# Patient Record
Sex: Female | Born: 2000 | Race: White | Hispanic: No | Marital: Single | Smoking: Never smoker
Health system: Southern US, Community
[De-identification: ages and names within clinical notes are randomized; demographics above are authoritative.]

## PROBLEM LIST (undated history)

## (undated) DIAGNOSIS — R51 Headache: Secondary | ICD-10-CM

## (undated) DIAGNOSIS — M069 Rheumatoid arthritis, unspecified: Secondary | ICD-10-CM

## (undated) DIAGNOSIS — M1990 Unspecified osteoarthritis, unspecified site: Secondary | ICD-10-CM

## (undated) DIAGNOSIS — R519 Headache, unspecified: Secondary | ICD-10-CM

## (undated) HISTORY — DX: Headache: R51

## (undated) HISTORY — DX: Headache, unspecified: R51.9

---
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
## 2022-01-30 ENCOUNTER — Emergency Department (HOSPITAL_BASED_OUTPATIENT_CLINIC_OR_DEPARTMENT_OTHER): Payer: Managed Care, Other (non HMO)

## 2022-01-30 ENCOUNTER — Other Ambulatory Visit: Payer: Self-pay

## 2022-01-30 ENCOUNTER — Emergency Department (HOSPITAL_BASED_OUTPATIENT_CLINIC_OR_DEPARTMENT_OTHER)
Admission: EM | Admit: 2022-01-30 | Discharge: 2022-01-30 | Disposition: A | Discharge status: Home / Self Care | Payer: Managed Care, Other (non HMO) | Attending: Emergency Medicine | Admitting: Emergency Medicine

## 2022-01-30 ENCOUNTER — Encounter (HOSPITAL_BASED_OUTPATIENT_CLINIC_OR_DEPARTMENT_OTHER): Payer: Self-pay | Admitting: *Deleted

## 2022-01-30 DIAGNOSIS — Z3A01 Less than 8 weeks gestation of pregnancy: Secondary | ICD-10-CM | POA: Diagnosis not present

## 2022-01-30 DIAGNOSIS — R1032 Left lower quadrant pain: Secondary | ICD-10-CM | POA: Diagnosis not present

## 2022-01-30 DIAGNOSIS — Z3491 Encounter for supervision of normal pregnancy, unspecified, first trimester: Secondary | ICD-10-CM

## 2022-01-30 DIAGNOSIS — O26891 Other specified pregnancy related conditions, first trimester: Secondary | ICD-10-CM | POA: Insufficient documentation

## 2022-01-30 DIAGNOSIS — O0281 Inappropriate change in quantitative human chorionic gonadotropin (hCG) in early pregnancy: Secondary | ICD-10-CM | POA: Insufficient documentation

## 2022-01-30 DIAGNOSIS — R1030 Lower abdominal pain, unspecified: Secondary | ICD-10-CM

## 2022-01-30 HISTORY — DX: Rheumatoid arthritis, unspecified: M06.9

## 2022-01-30 LAB — COMPREHENSIVE METABOLIC PANEL
ALT: 60 U/L — ABNORMAL HIGH (ref 0–44)
AST: 46 U/L — ABNORMAL HIGH (ref 15–41)
Albumin: 4.3 g/dL (ref 3.5–5.0)
Alkaline Phosphatase: 72 U/L (ref 38–126)
Anion gap: 8 (ref 5–15)
BUN: <5 mg/dL — ABNORMAL LOW (ref 6–20)
CO2: 23 mmol/L (ref 22–32)
Calcium: 9 mg/dL (ref 8.9–10.3)
Chloride: 104 mmol/L (ref 98–111)
Creatinine, Ser: 0.47 mg/dL (ref 0.44–1.00)
GFR, Estimated: >60 mL/min (ref >60)
Glucose, Bld: 94 mg/dL (ref 70–99)
Potassium: 3.6 mmol/L (ref 3.5–5.1)
Sodium: 135 mmol/L (ref 135–145)
Total Bilirubin: 0.6 mg/dL (ref 0.3–1.2)
Total Protein: 7.5 g/dL (ref 6.5–8.1)

## 2022-01-30 LAB — WET PREP, GENITAL
Sperm: NONE SEEN
Trich, Wet Prep: NONE SEEN
WBC, Wet Prep HPF POC: >=10 — AB (ref <10)
Yeast Wet Prep HPF POC: NONE SEEN

## 2022-01-30 LAB — CBC WITH DIFFERENTIAL/PLATELET
Abs Immature Granulocytes: 0.01 10*3/uL (ref 0.00–0.07)
Basophils Absolute: 0 10*3/uL (ref 0.0–0.1)
Basophils Relative: 0 %
Eosinophils Absolute: 0.1 10*3/uL (ref 0.0–0.5)
Eosinophils Relative: 1 %
HCT: 40 % (ref 36.0–46.0)
Hemoglobin: 13.7 g/dL (ref 12.0–15.0)
Immature Granulocytes: 0 %
Lymphocytes Relative: 39 %
Lymphs Abs: 2.9 10*3/uL (ref 0.7–4.0)
MCH: 30.3 pg (ref 26.0–34.0)
MCHC: 34.3 g/dL (ref 30.0–36.0)
MCV: 88.5 fL (ref 80.0–100.0)
Monocytes Absolute: 0.5 10*3/uL (ref 0.1–1.0)
Monocytes Relative: 6 %
Neutro Abs: 4 10*3/uL (ref 1.7–7.7)
Neutrophils Relative %: 54 %
Platelets: 315 10*3/uL (ref 150–400)
RBC: 4.52 MIL/uL (ref 3.87–5.11)
RDW: 12.3 % (ref 11.5–15.5)
WBC: 7.5 10*3/uL (ref 4.0–10.5)
nRBC: 0 % (ref 0.0–0.2)

## 2022-01-30 LAB — URINALYSIS, ROUTINE W REFLEX MICROSCOPIC
Bilirubin Urine: NEGATIVE
Glucose, UA: NEGATIVE mg/dL
Hgb urine dipstick: NEGATIVE
Ketones, ur: NEGATIVE mg/dL
Nitrite: NEGATIVE
Protein, ur: NEGATIVE mg/dL
Specific Gravity, Urine: 1.015 (ref 1.005–1.030)
pH: 6.5 (ref 5.0–8.0)

## 2022-01-30 LAB — PREGNANCY, URINE: Preg Test, Ur: POSITIVE — AB

## 2022-01-30 LAB — HCG, QUANTITATIVE, PREGNANCY: hCG, Beta Chain, Quant, S: 4292 m[IU]/mL — ABNORMAL HIGH (ref <5)

## 2022-01-30 LAB — URINALYSIS, MICROSCOPIC (REFLEX): RBC / HPF: NONE SEEN RBC/hpf (ref 0–5)

## 2022-01-30 MED ORDER — ACETAMINOPHEN 500 MG PO TABS
1000.0000 mg | ORAL_TABLET | Freq: Once | ORAL | Status: AC
  Administered 2022-01-30: 1000 mg via ORAL
  Filled 2022-01-30: qty 2

## 2022-01-30 NOTE — Discharge Instructions (Signed)
The labs today are consistent with an early pregnancy and your ultrasound shows that you most likely have an early pregnancy in your uterus and is just really early and you will need a repeat ultrasound to confirm.  There is no evidence of urinary tract infection or abnormal swabs today.  You can take Tylenol as needed for the pain and you can continue drinking fluids.  You should follow-up with the OB or return to the ER if you start having severe pain and vaginal bleeding.  It is not unusual to have a little bit of spotting after the exam you had today.

## 2022-01-30 NOTE — ED Provider Notes (Signed)
[facility] EMERGENCY DEPARTMENT Provider Note   CSN: [id number] Arrival date & time: 01/30/22  1747     History  Chief Complaint  Patient presents with   Abdominal Pain    Gwendolyn Herrera is a 21 y.o. female.  Patient is a 21 year old G1 P0 who is approximately [redacted] weeks pregnant presenting today with complaints of lower abdominal pain.  She reports the pain was in the center yesterday but then has moved to the left side today.  It is a sharp stabbing type pain that is now constant.  She did take Tylenol yesterday and it helped but has not taken anything today.  She has not had any vaginal discharge or bleeding.  She denies any nausea or vomiting.  No fever or dysuria.  The history is provided by the patient.  Abdominal Pain     Home Medications Prior to Admission medications   Medication Sig Start Date End Date Taking? Authorizing Provider  Abatacept (ORENCIA IV) Inject into the vein. Gets injections every 3 weeks    [provider]  amitriptyline (ELAVIL) 25 MG tablet TAKE 1 & 1/2 TABLETS BY MOUTH AT BEDTIME. 05/22/15   Teressa Lower, MD  azithromycin (ZITHROMAX Z-PAK) 250 MG tablet Take 2 tabs today; then begin one tab once daily for 4 more days. (Rx void after 02/14/15) 02/06/15   Kandra Nicolas, MD  benzonatate (TESSALON) 200 MG capsule Take 1 capsule (200 mg total) by mouth at bedtime. Take as needed for cough 02/06/15   Kandra Nicolas, MD  folic acid (FOLVITE) 1 MG tablet Take 1 mg by mouth.    [provider]  leflunomide (ARAVA) 10 MG tablet Take 10 mg by mouth.    [provider]  Magnesium Oxide 500 MG TABS Take by mouth.    [provider]  montelukast (SINGULAIR) 10 MG tablet Take 10 mg by mouth.    [provider]  nabumetone (RELAFEN) 750 MG tablet Take 750 mg by mouth.    [provider]  riboflavin (VITAMIN B-2) 100 MG TABS tablet Take 100 mg by mouth daily.    [provider]  rizatriptan  (MAXALT-MLT) 5 MG disintegrating tablet Take 1 tablet (5 mg total) by mouth as needed for migraine. Maximum 2 tablets a week 10/25/14   Teressa Lower, MD      Allergies    Patient has no known allergies.    Review of Systems   Review of Systems  Gastrointestinal:  Positive for abdominal pain.   Physical Exam Updated Vital Signs BP 125/73 (BP Location: Left Arm)    Pulse 91    Temp 98.6 F (37 C) (Oral)    Resp 16    Ht 5\' 3"  (1.6 m)    Wt 63.5 kg    LMP 12/25/2021    SpO2 100%    BMI 24.80 kg/m  Physical Exam Vitals and nursing note reviewed.  Constitutional:      General: She is not in acute distress.    Appearance: She is well-developed.  HENT:     Head: Normocephalic and atraumatic.  Eyes:     Conjunctiva/sclera: Conjunctivae normal.     Pupils: Pupils are equal, round, and reactive to light.  Cardiovascular:     Rate and Rhythm: Normal rate and regular rhythm.     Heart sounds: No murmur heard. Pulmonary:     Effort: Pulmonary effort is normal. No respiratory distress.     Breath sounds: Normal breath  sounds. No wheezing or rales.  Abdominal:     General: There is no distension.     Palpations: Abdomen is soft.     Tenderness: There is abdominal tenderness in the left lower quadrant. There is no left CVA tenderness, guarding or rebound.  Genitourinary:    Vagina: Normal.     Cervix: Normal.     Uterus: Normal.      Adnexa: Right adnexa normal and left adnexa normal.       Right: No mass, tenderness or fullness.         Left: No mass, tenderness or fullness.    Musculoskeletal:        General: No tenderness. Normal range of motion.     Cervical back: Normal range of motion and neck supple.  Skin:    General: Skin is warm and dry.     Findings: No erythema or rash.  Neurological:     Mental Status: She is alert and oriented to person, place, and time.  Psychiatric:        Behavior: Behavior normal.    ED Results / Procedures / Treatments   Labs (all labs  ordered are listed, but only abnormal results are displayed) Labs Reviewed  WET PREP, GENITAL - Abnormal; Notable for the following components:      Result Value   Clue Cells Wet Prep HPF POC PRESENT (*)    WBC, Wet Prep HPF POC >=10 (*)    All other components within normal limits  COMPREHENSIVE METABOLIC PANEL - Abnormal; Notable for the following components:   BUN <5 (*)    AST 46 (*)    ALT 60 (*)    All other components within normal limits  URINALYSIS, ROUTINE W REFLEX MICROSCOPIC - Abnormal; Notable for the following components:   Leukocytes,Ua TRACE (*)    All other components within normal limits  PREGNANCY, URINE - Abnormal; Notable for the following components:   Preg Test, Ur POSITIVE (*)    All other components within normal limits  HCG, QUANTITATIVE, PREGNANCY - Abnormal; Notable for the following components:   hCG, Beta Chain, Quant, S 4,292 (*)    All other components within normal limits  URINALYSIS, MICROSCOPIC (REFLEX) - Abnormal; Notable for the following components:   Bacteria, UA RARE (*)    All other components within normal limits  CBC WITH DIFFERENTIAL/PLATELET  GC/CHLAMYDIA PROBE AMP ([place]) NOT AT [facility]    EKG None  Radiology US OB LESS THAN 14 WEEKS WITH OB TRANSVAGINAL  Result Date: 01/30/2022 CLINICAL DATA:  Lower abdominal pain during pregnancy. EXAM: OBSTETRIC <14 WK [place] AND TRANSVAGINAL OB US TECHNIQUE: Both transabdominal and transvaginal ultrasound examinations were performed for complete evaluation of the gestation as well as the maternal uterus, adnexal regions, and pelvic cul-de-sac. Transvaginal technique was performed to assess early pregnancy. COMPARISON:  None. FINDINGS: Intrauterine gestational sac: Single Yolk sac:  Visualized. Embryo:  Visualized. MSD: 4.5 mm mm   5 w   2 d Subchorionic hemorrhage:  None visualized. Maternal uterus/adnexae: There is a corpus luteum versus collapsing hemorrhagic cyst in the right ovary measuring 1.5 x  0.7 by 1.5 cm. Left ovary is within normal limits. No free fluid identified in the pelvis. IMPRESSION: Probable early intrauterine gestational sac, but no yolk sac, fetal pole, or cardiac activity yet visualized. Recommend follow-up quantitative B-HCG levels and follow-up US in 14 days to assess viability. This recommendation follows SRU consensus guidelines: Diagnostic Criteria for Nonviable Pregnancy Early in  the First Trimester. [place] [place] Med [id number]:[id number]. Electronically Signed   By: Ronney Asters M.D.   On: 01/30/2022 19:56    Procedures Procedures    Medications Ordered in ED Medications  acetaminophen (TYLENOL) tablet 1,000 mg (1,000 mg Oral Given 01/30/22 1815)    ED Course/ Medical Decision Making/ A&P                           Medical Decision Making Amount and/or Complexity of Data Reviewed Labs: ordered. Radiology: ordered.  Risk OTC drugs.   Patient is a 21 year old female presenting today with left sided abdominal pain in the setting of being approximately [redacted] weeks pregnant.  Patient denies any other symptoms other than pain.  She has not seen an OB/GYN yet.  This is her first time being pregnant.  She denies any urinary symptoms.  Concern for possible ectopic pregnancy versus normal pains from early pregnancy.  Low suspicion for TOA, ovarian cyst, UTI or kidney stone.  No symptoms to suggest appendicitis.  8:14 PM I independently interpreted patient's labs and she has a positive urine pregnancy test with a negative UA and normal CBC.  Transvaginal ultrasound is pending and hCG.  8:14 PM I independently evaluated patient's ultrasound and it appears she has a gestational sac in the uterus.  Radiology reports Assubel early intrauterine gestational sac but no yolk sac or fetal pole at this time.  Recommend follow-up hCG.  Current hCG is 4000.  Patient is otherwise well-appearing.  Findings discussed with the patient and her partner.  She was discharged home in good condition.   No indication for admission at this time.       Final Clinical Impression(s) / ED Diagnoses Final diagnoses:  First trimester pregnancy    Rx / DC Orders ED Discharge Orders     None         Blanchie Dessert, MD 01/30/22 2015

## 2022-01-30 NOTE — ED Triage Notes (Signed)
C/o lower left abd pain with nausea x 2 days, estimated 5 weeks preg LMP 12/29. Denies vaginal bleeding

## 2022-01-30 NOTE — ED Notes (Signed)
Pt A&OX4 ambulatory at d/c with independent steady gait, NAD. Pt verbalized understanding of d/c instructions and follow up care. ?

## 2022-01-31 LAB — GC/CHLAMYDIA PROBE AMP (~~LOC~~) NOT AT ARMC
Chlamydia: NEGATIVE
Comment: NEGATIVE
Comment: NORMAL
Neisseria Gonorrhea: NEGATIVE

## 2023-01-13 IMAGING — US US OB < 14 WEEKS - US OB TV
2 series · 13 of 28 positions shown · non-contrast
Comparison: None.

CLINICAL DATA: Lower abdominal pain during pregnancy.

EXAM:
OBSTETRIC <14 WK US AND TRANSVAGINAL OB US
TECHNIQUE: Both transabdominal and transvaginal ultrasound examinations were
performed for complete evaluation of the gestation as well as the
maternal uterus, adnexal regions, and pelvic cul-de-sac.
Transvaginal technique was performed to assess early pregnancy.

[Series 1: us ob < 14 weeks - us ob tv · 4 of 27 slices shown (1 of 2)]
[im 4/27]
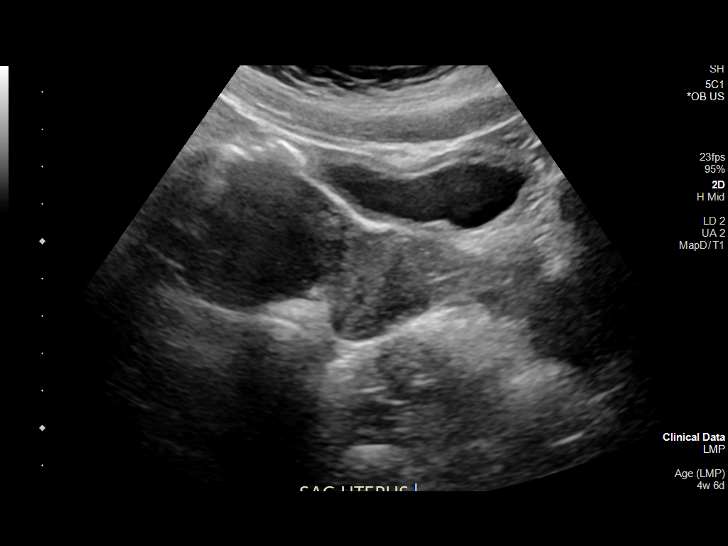
[im 10/27]
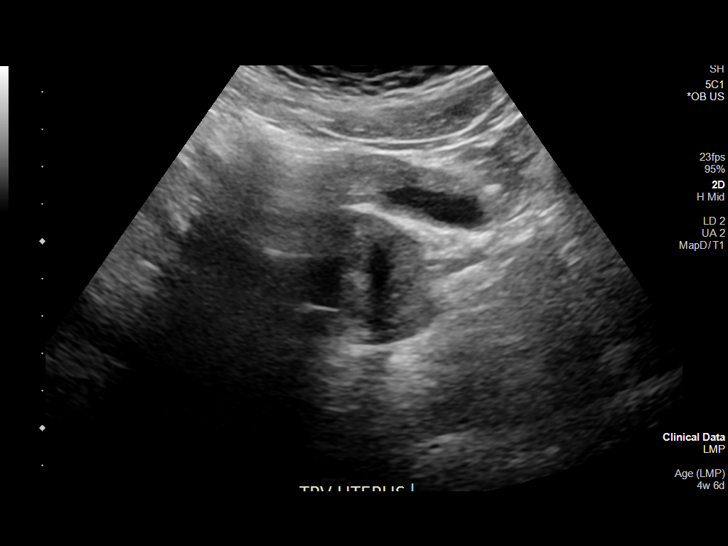
[im 17/27]
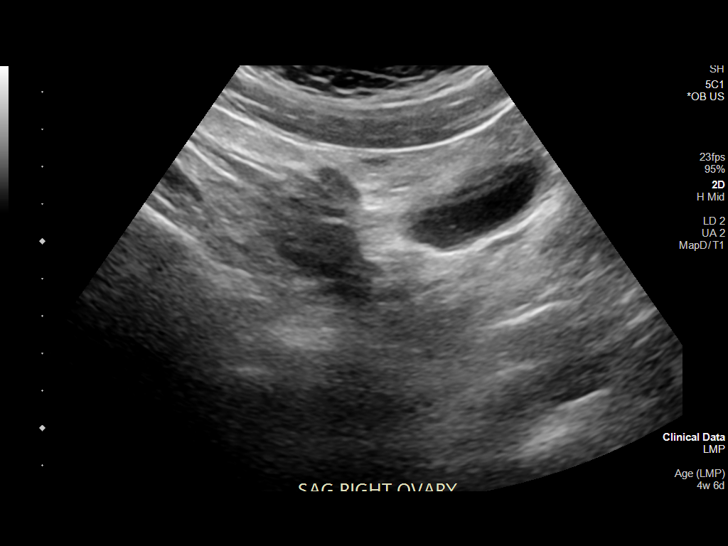
[im 23/27]
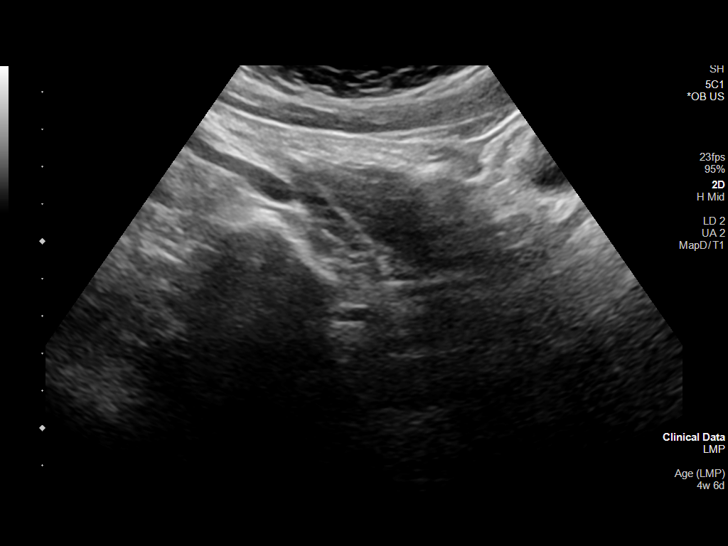

[Series 2: us ob < 14 weeks - us ob tv · 9 of 61 slices shown (2 of 2)]
[im 1/61]
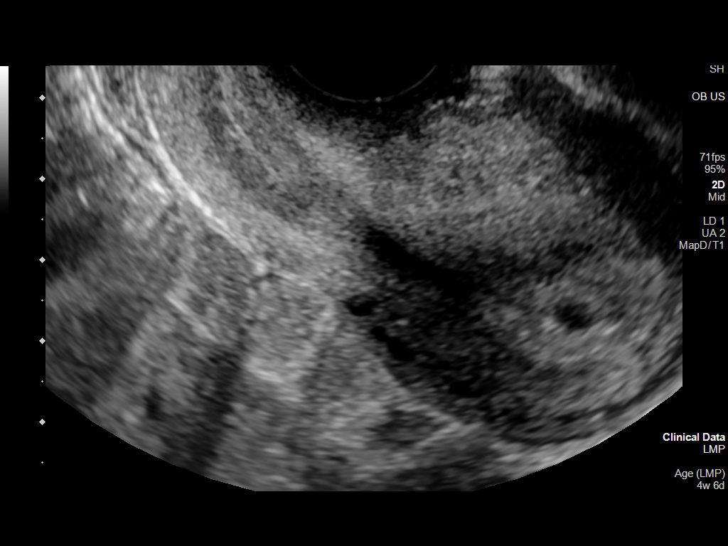
[im 7/61]
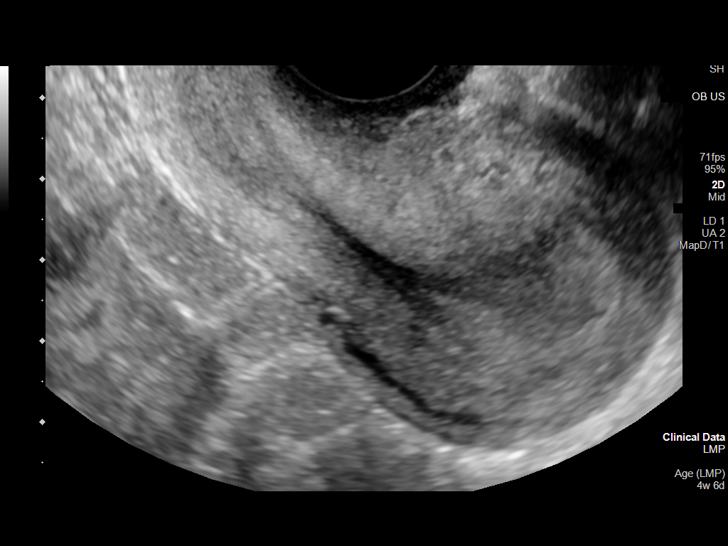
[im 17/61]
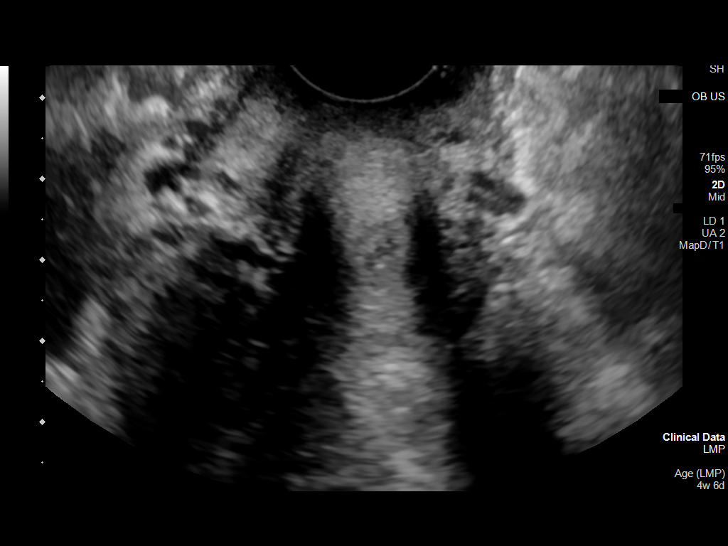
[im 24/61]
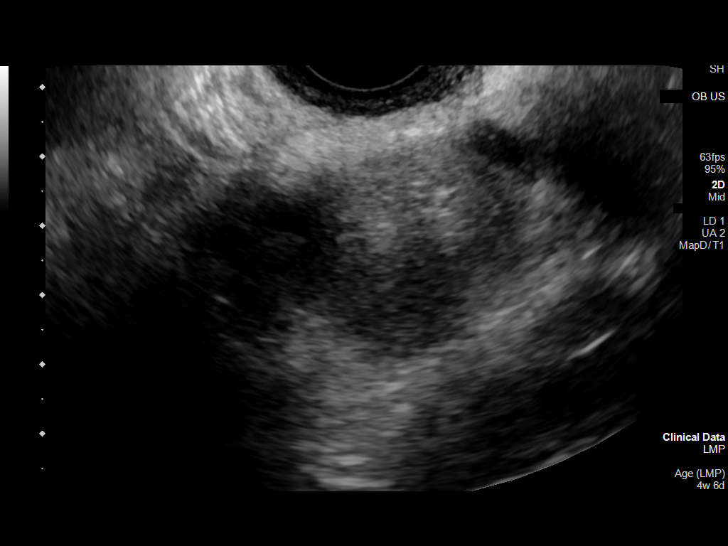
[im 31/61]
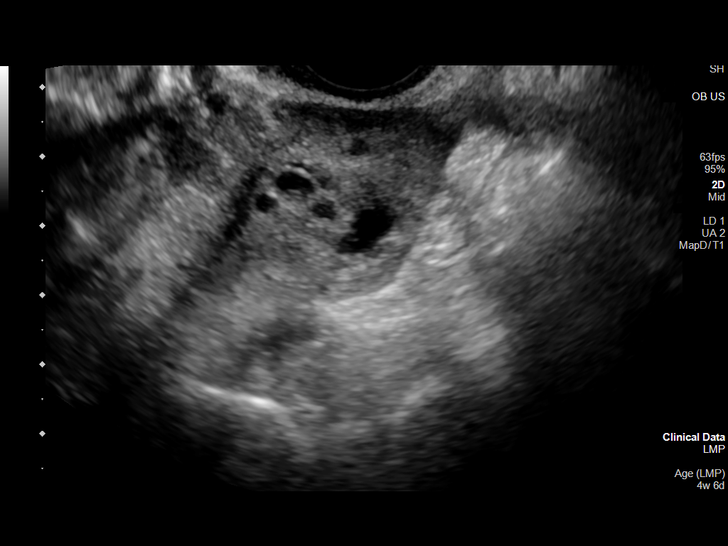
[im 37/61]
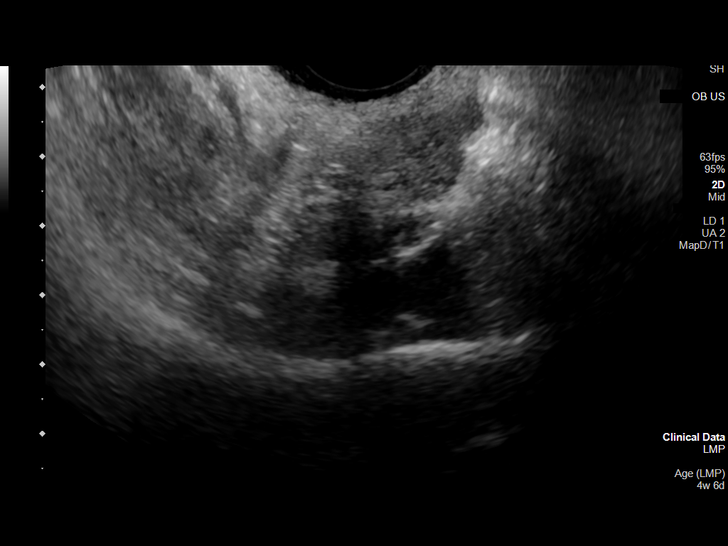
[im 44/61]
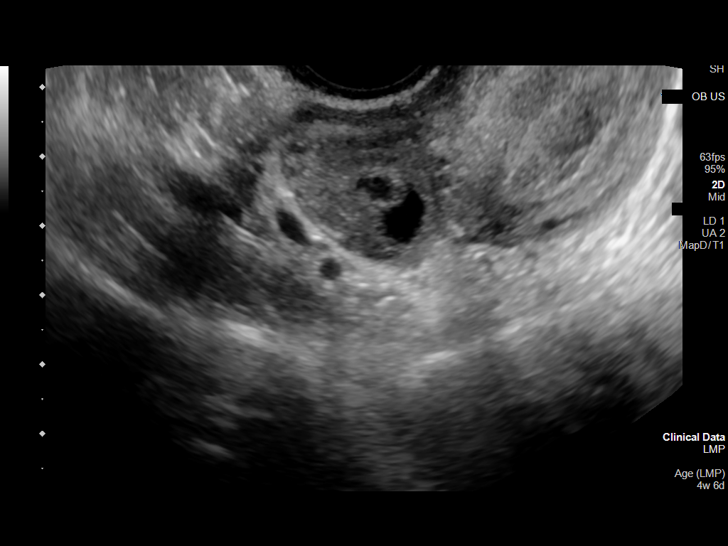
[im 51/61]
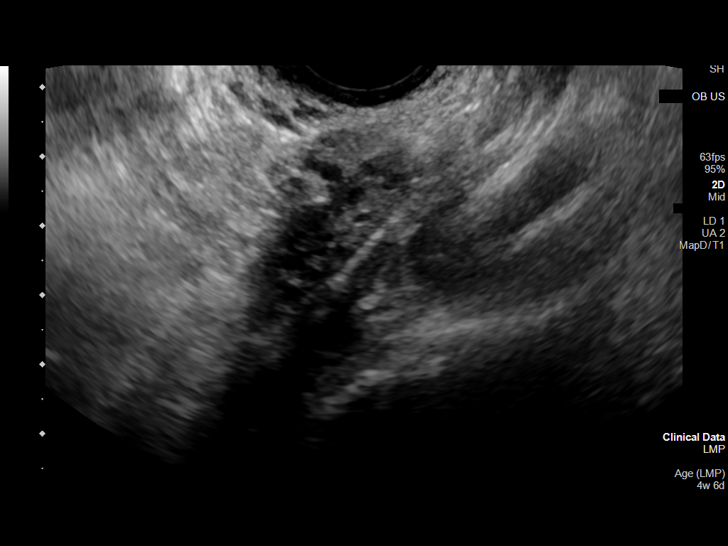
[im 57/61]
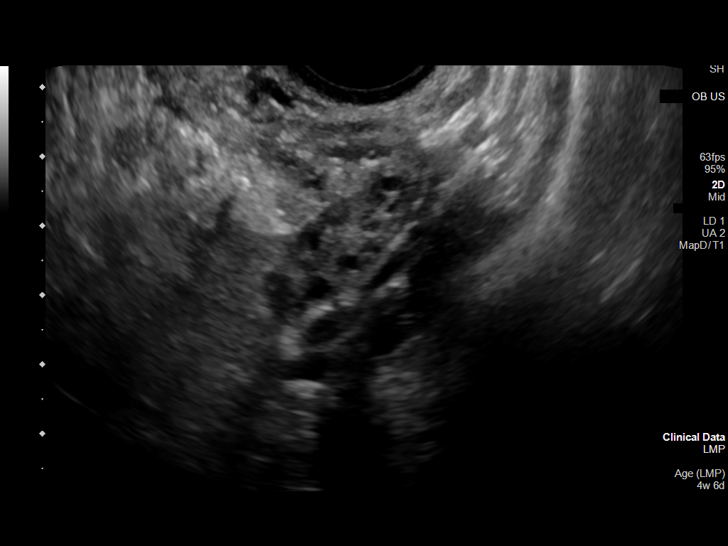

[13 of 28 positions shown; findings below may reference images not displayed]

FINDINGS: Intrauterine gestational sac: Single

Yolk sac:  Visualized.

Embryo:  Visualized.

MSD: 4.5 mm mm   5 w   2 d

Subchorionic hemorrhage:  None visualized.

Maternal uterus/adnexae: There is a corpus luteum versus collapsing
hemorrhagic cyst in the right ovary measuring 1.5 x 0.7 by 1.5 cm.
Left ovary is within normal limits. No free fluid identified in the
pelvis.
IMPRESSION: Probable early intrauterine gestational sac, but no yolk sac, fetal
pole, or cardiac activity yet visualized. Recommend follow-up
quantitative B-HCG levels and follow-up US in 14 days to assess
viability. This recommendation follows SRU consensus guidelines:
Diagnostic Criteria for Nonviable Pregnancy Early in the First
Trimester. N Engl J Med 5194; [DATE].
